# Patient Record
Sex: Male | Born: 1985 | Race: Black or African American | Hispanic: No | Marital: Single | Smoking: Current some day smoker
Health system: Southern US, Community
[De-identification: ages and names within clinical notes are randomized; demographics above are authoritative.]

## PROBLEM LIST (undated history)

## (undated) DIAGNOSIS — F419 Anxiety disorder, unspecified: Secondary | ICD-10-CM

## (undated) DIAGNOSIS — B019 Varicella without complication: Secondary | ICD-10-CM

## (undated) DIAGNOSIS — I10 Essential (primary) hypertension: Secondary | ICD-10-CM

## (undated) HISTORY — DX: Anxiety disorder, unspecified: F41.9

## (undated) HISTORY — DX: Essential (primary) hypertension: I10

## (undated) HISTORY — PX: NO PAST SURGERIES: SHX2092

## (undated) HISTORY — DX: Varicella without complication: B01.9

---

## 2005-03-07 ENCOUNTER — Emergency Department (HOSPITAL_COMMUNITY): Admission: EM | Admit: 2005-03-07 | Discharge: 2005-03-07 | Payer: Self-pay | Admitting: Family Medicine

## 2009-08-29 ENCOUNTER — Emergency Department (HOSPITAL_BASED_OUTPATIENT_CLINIC_OR_DEPARTMENT_OTHER): Admission: EM | Admit: 2009-08-29 | Discharge: 2009-08-29 | Payer: Self-pay | Admitting: Emergency Medicine

## 2009-08-29 ENCOUNTER — Ambulatory Visit: Payer: Self-pay | Admitting: Diagnostic Radiology

## 2010-11-11 IMAGING — CR DG CHEST 2V
2 series · 2 of 2 positions shown · non-contrast
Comparison: None.

CLINICAL DATA: Wheezing.  Congestion.  Cough.

CHEST - 2 VIEW

[w chest pa]
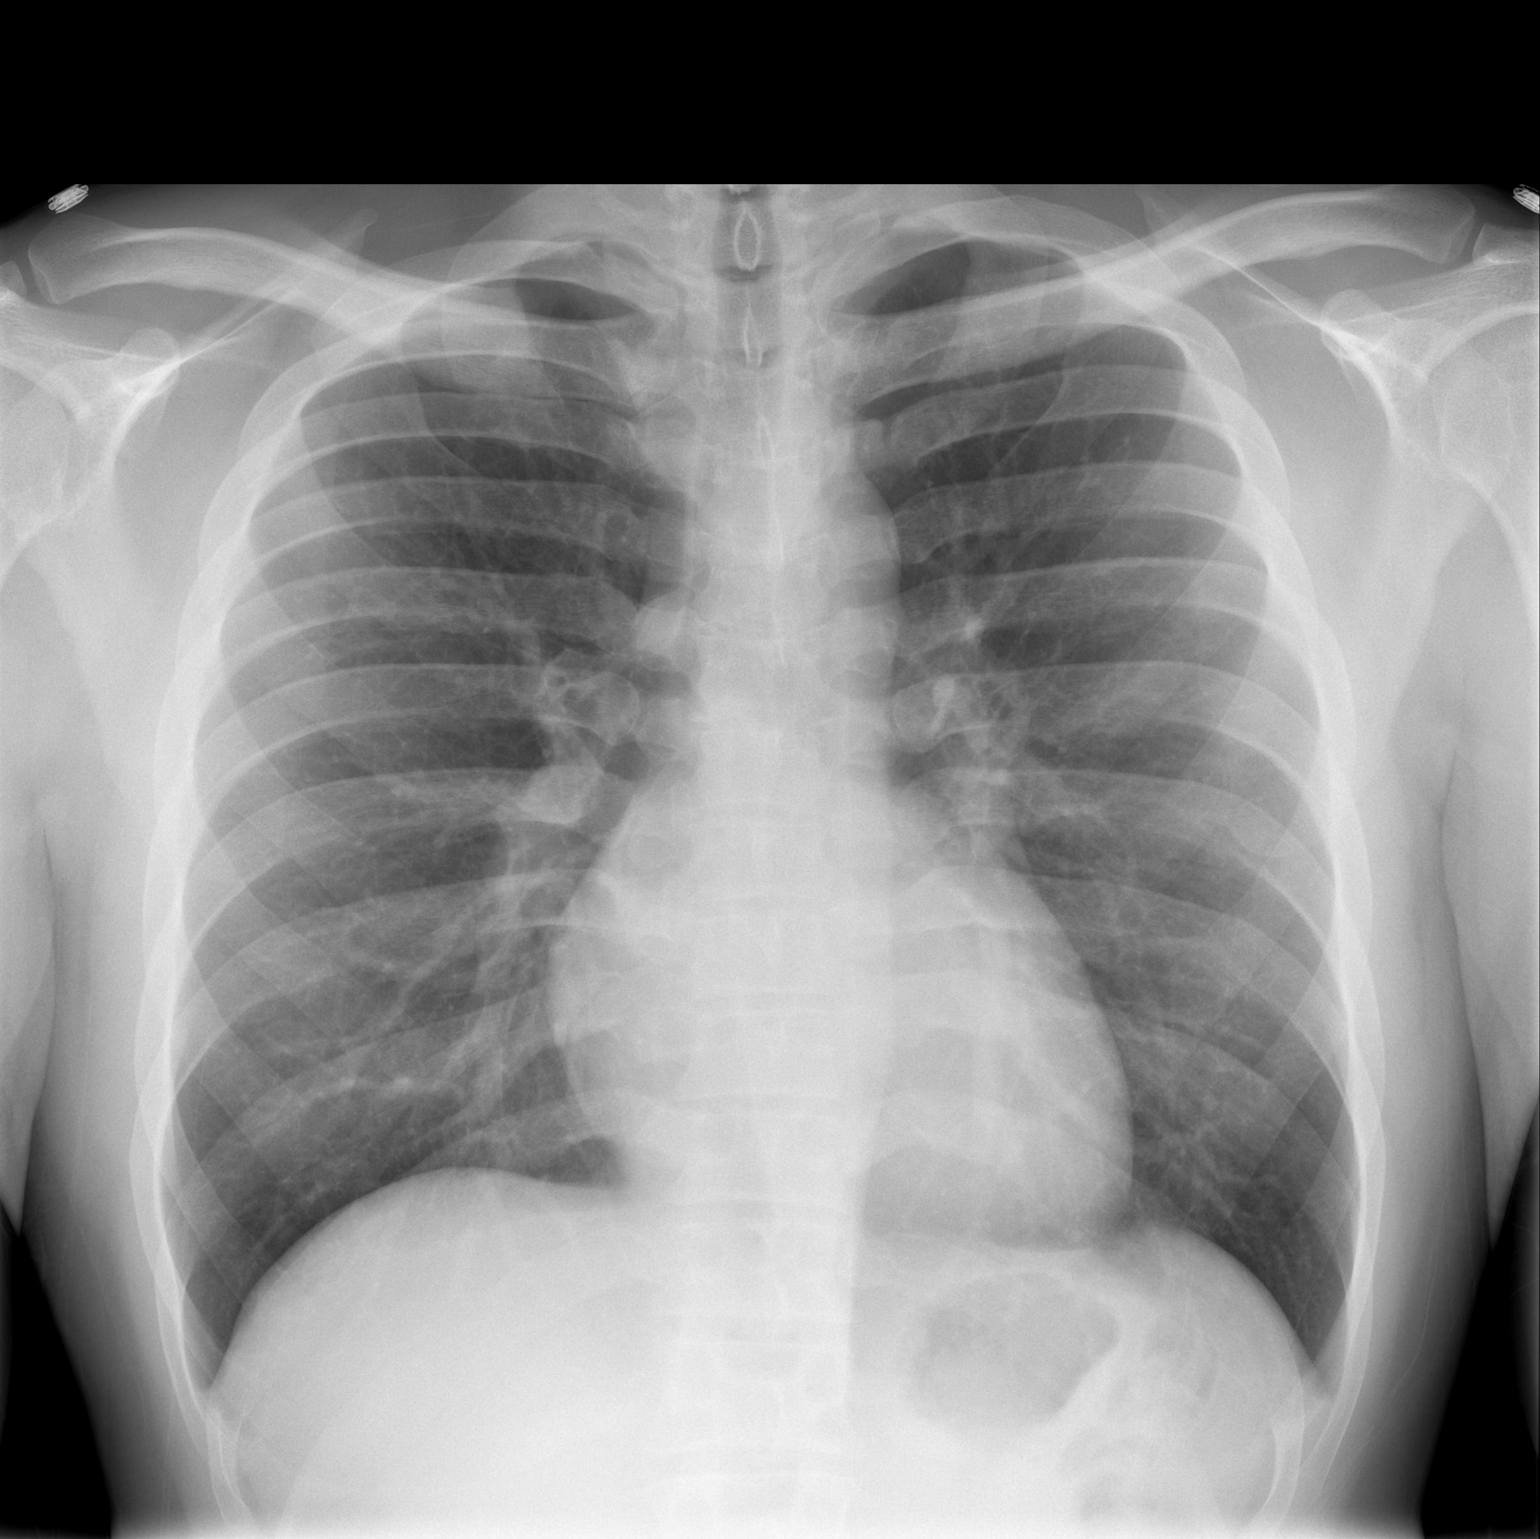

[w chest lat]
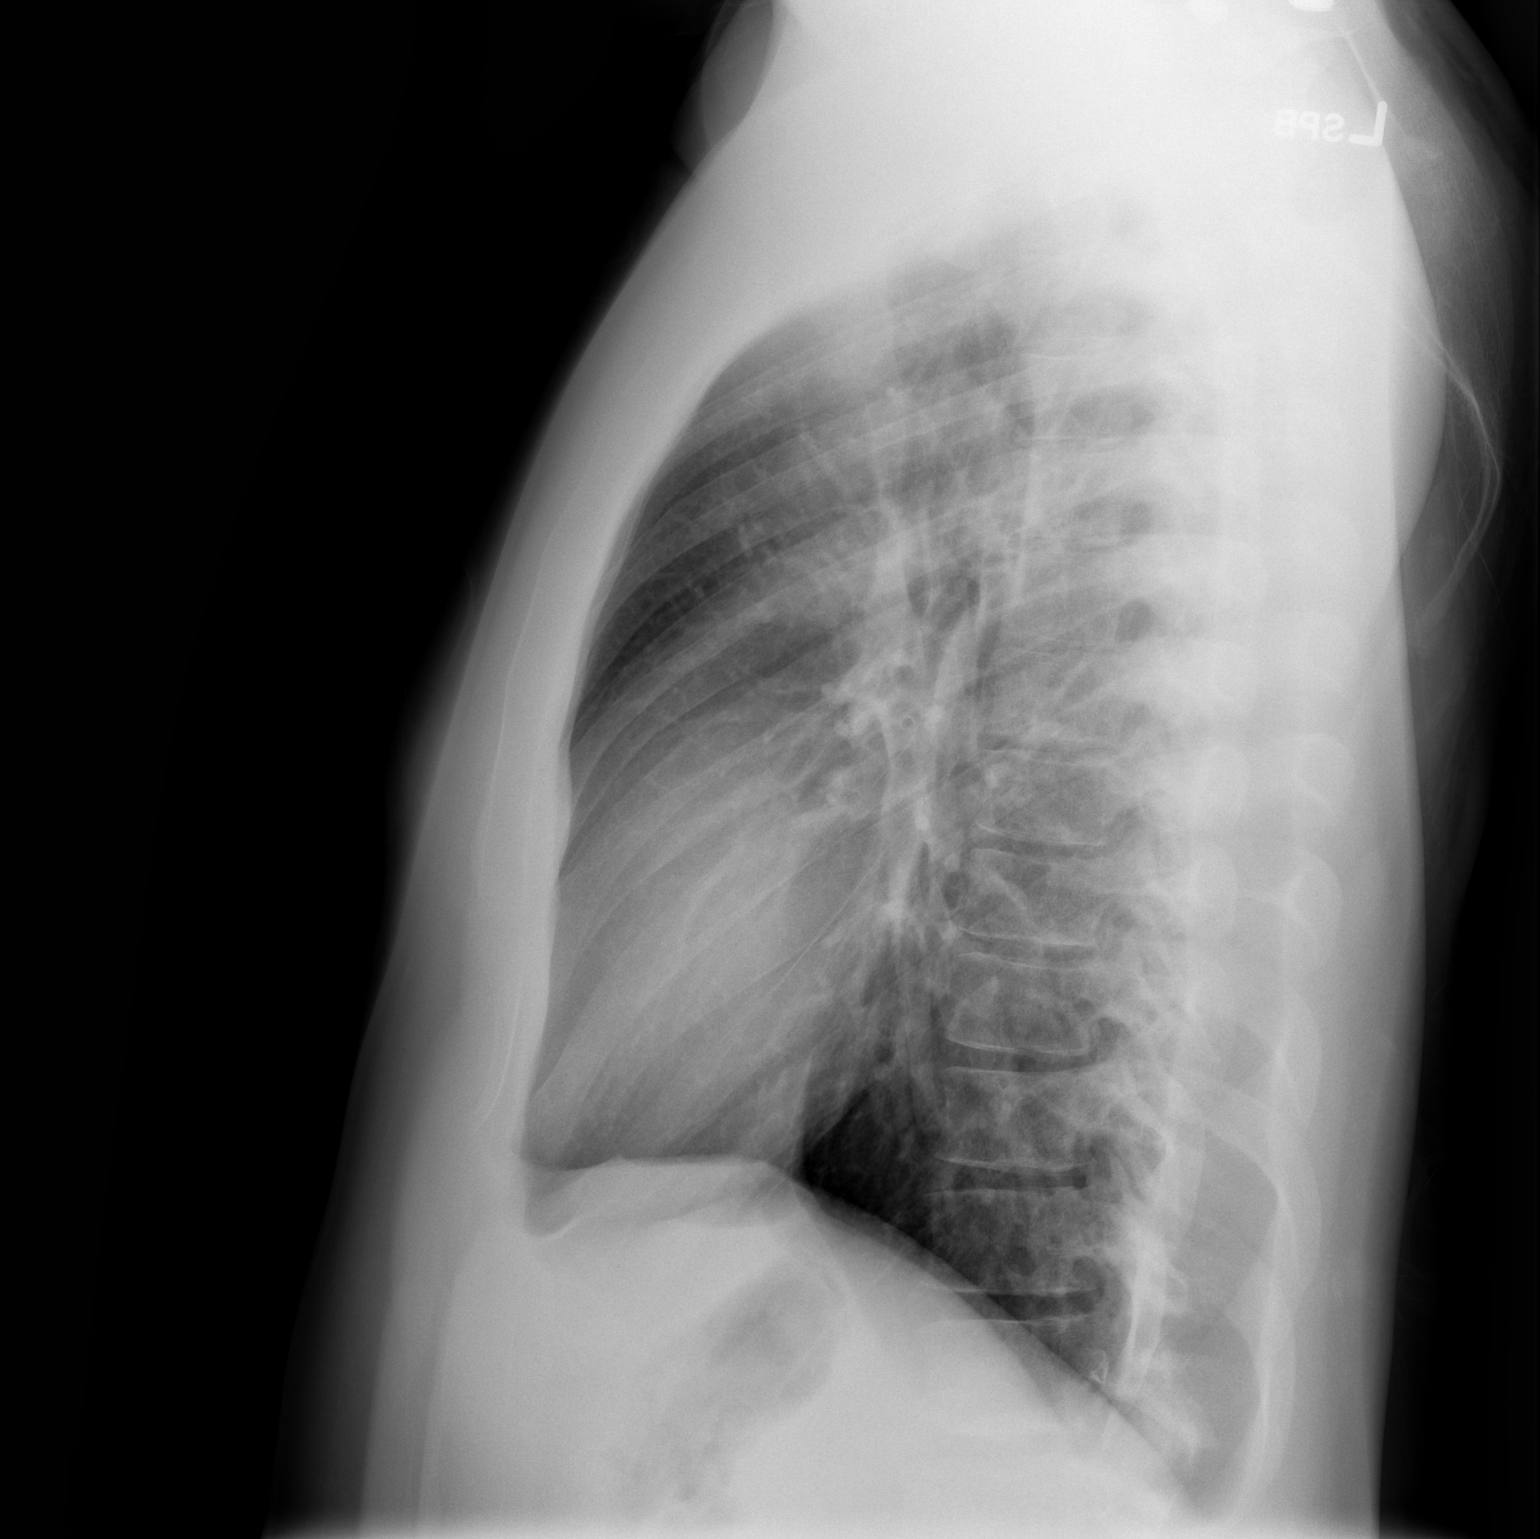

[2 of 2 positions shown; findings below may reference images not displayed]

FINDINGS: Lungs clear.  Cardiopericardial silhouette within normal
limits.
IMPRESSION: No active cardiopulmonary disease.

## 2015-10-08 ENCOUNTER — Encounter (HOSPITAL_BASED_OUTPATIENT_CLINIC_OR_DEPARTMENT_OTHER): Payer: Self-pay | Admitting: Emergency Medicine

## 2015-10-08 ENCOUNTER — Emergency Department (HOSPITAL_BASED_OUTPATIENT_CLINIC_OR_DEPARTMENT_OTHER)
Admission: EM | Admit: 2015-10-08 | Discharge: 2015-10-08 | Disposition: A | Discharge status: Home / Self Care | Payer: Self-pay | Attending: Emergency Medicine | Admitting: Emergency Medicine

## 2015-10-08 DIAGNOSIS — H5789 Other specified disorders of eye and adnexa: Secondary | ICD-10-CM

## 2015-10-08 DIAGNOSIS — F17200 Nicotine dependence, unspecified, uncomplicated: Secondary | ICD-10-CM | POA: Insufficient documentation

## 2015-10-08 DIAGNOSIS — H578 Other specified disorders of eye and adnexa: Secondary | ICD-10-CM | POA: Insufficient documentation

## 2015-10-08 MED ORDER — ERYTHROMYCIN 5 MG/GM OP OINT: TOPICAL_OINTMENT | OPHTHALMIC | Status: DC

## 2015-10-08 MED ORDER — FLUORESCEIN SODIUM 1 MG OP STRP
1.0000 | ORAL_STRIP | Freq: Once | OPHTHALMIC | Status: DC
  Filled 2015-10-08: qty 1

## 2015-10-08 NOTE — ED Notes (Signed)
DC instruction rev with pt and significant other, instruction on how to apply abx eye oint demonstrated and discussed, pt able to repeat back instructions, also stressed the importance of handwashing before and after doing eye oint therapy. Pts vs repeated prior to dc, again NBP noted to be elevated , discussed with pt and recommended finding an primary MD for follow, info sheet provided to pt

## 2015-10-08 NOTE — ED Provider Notes (Signed)
CSN: [id number]     Arrival date & time 10/08/15  0913 History   First MD Initiated Contact with Patient 10/08/15 [phone]     Chief Complaint  Patient presents with  . Eye Problem     (Consider location/radiation/quality/duration/timing/severity/associated sxs/prior Treatment) Patient is a 29 y.o. male presenting with eye problem. The history is provided by the patient.  Eye Problem Associated symptoms: redness   Associated symptoms: no headaches and no photophobia    patient awoke this morning at 6 in the morning with irritation to the left eye towards the nose. Patient felt as if there was a foreign body there. No history of any exposure to suggest a foreign body got in there. Patient does not wear contacts. No fevers. No rash no history of similar problem. No significant visual changes.  No past medical history on file. No past surgical history on file. No family history on file. Social History  Substance Use Topics  . Smoking status: Current Every Day Smoker  . Smokeless tobacco: None  . Alcohol Use: None    Review of Systems  Constitutional: Negative for fever.  HENT: Negative for congestion.   Eyes: Positive for pain and redness. Negative for photophobia.  Respiratory: Negative for shortness of breath.   Cardiovascular: Negative for chest pain.  Gastrointestinal: Negative for abdominal pain.  Musculoskeletal: Negative for neck pain.  Skin: Negative for rash.  Neurological: Negative for headaches.  Hematological: Does not bruise/bleed easily.  Psychiatric/Behavioral: Negative for confusion.      Allergies  Review of patient's allergies indicates no known allergies.  Home Medications   Prior to Admission medications   Medication Sig Start Date End Date Taking? Authorizing Provider  erythromycin ophthalmic ointment Place a 1/2 inch ribbon of ointment into the left lower eyelid. 10/08/15   Vanetta MuldersScott Jordi Lacko, MD   BP 146/99 mmHg  Pulse 81  Temp(Src) 97.5 F (36.4 C)  (Oral)  Resp 18  Ht 6\' 2"  (1.88 m)  Wt 245 lb (111.131 kg)  BMI 31.44 kg/m2  SpO2 100% Physical Exam  Constitutional: He is oriented to person, place, and time. He appears well-developed and well-nourished. No distress.  HENT:  Head: Normocephalic and atraumatic.  Mouth/Throat: Oropharynx is clear and moist.  Eyes: Conjunctivae and EOM are normal. Pupils are equal, round, and reactive to light.  Fluoroscopy seen staining and black light examination without evidence of any corneal abrasion or ulcer. No evidence of any foreign body. No hyphema.  Neck: Normal range of motion. Neck supple.  Cardiovascular: Normal rate and normal heart sounds.   No murmur heard. Pulmonary/Chest: Effort normal and breath sounds normal. No respiratory distress.  Abdominal: Soft. Bowel sounds are normal. There is no tenderness.  Neurological: He is alert and oriented to person, place, and time.  Nursing note and vitals reviewed.   ED Course  Procedures (including critical care time) Labs Review Labs Reviewed - No data to display  Imaging Review No results found. I have personally reviewed and evaluated these images and lab results as part of my medical decision-making.   EKG Interpretation None      MDM   Final diagnoses:  Irritation of left eye    Fluoroscopy seen staining and black light examination of left eye without evidence of any corneal abrasion or ulcer. No evidence of any scleral foreign body. The year tenths seems to be on the sclera towards the medial aspect. Suspect there was a foreign body there overnight and now is gone and just  has irritation. Will treat with erythromycin ophthalmic ointment. Patient will return for any new or worse symptoms. Patient does not wear contacts.    Vanetta Mulders, MD 10/08/15 1045

## 2015-10-08 NOTE — Discharge Instructions (Signed)
No evidence of any foreign body to left eye. No evidence of any abrasion or scratch to the cornea of the left eye. Suspect you have scleral irritation from the foreign body that probably got in the eye overnight. Use the antibiotic ointment as directed. Return for any new or worse symptoms.

## 2015-10-08 NOTE — ED Notes (Signed)
Pt woke up this am with sensation of having something in his eye.  Pt states its irritated and some redness.  Pt put some visine in and redness has improved.

## 2015-11-15 ENCOUNTER — Encounter: Payer: Self-pay | Admitting: Physician Assistant

## 2015-11-15 ENCOUNTER — Ambulatory Visit (INDEPENDENT_AMBULATORY_CARE_PROVIDER_SITE_OTHER): Payer: PRIVATE HEALTH INSURANCE | Admitting: Physician Assistant

## 2015-11-15 VITALS — BP 160/98 | HR 74 | Temp 98.1°F | Ht 72.5 in | Wt 255.0 lb

## 2015-11-15 DIAGNOSIS — I10 Essential (primary) hypertension: Secondary | ICD-10-CM

## 2015-11-15 DIAGNOSIS — F329 Major depressive disorder, single episode, unspecified: Secondary | ICD-10-CM

## 2015-11-15 DIAGNOSIS — F419 Anxiety disorder, unspecified: Secondary | ICD-10-CM

## 2015-11-15 DIAGNOSIS — F418 Other specified anxiety disorders: Secondary | ICD-10-CM | POA: Diagnosis not present

## 2015-11-15 DIAGNOSIS — F17200 Nicotine dependence, unspecified, uncomplicated: Secondary | ICD-10-CM

## 2015-11-15 DIAGNOSIS — F32A Depression, unspecified: Secondary | ICD-10-CM | POA: Insufficient documentation

## 2015-11-15 LAB — BASIC METABOLIC PANEL
BUN: 9 mg/dL (ref 6–23)
CALCIUM: 9.4 mg/dL (ref 8.4–10.5)
CO2: 29 meq/L (ref 19–32)
CREATININE: 1.12 mg/dL (ref 0.40–1.50)
Chloride: 103 mEq/L (ref 96–112)
GFR: 98.75 mL/min (ref >60.00)
Glucose, Bld: 104 mg/dL — ABNORMAL HIGH (ref 70–99)
Potassium: 3.2 mEq/L — ABNORMAL LOW (ref 3.5–5.1)
SODIUM: 141 meq/L (ref 135–145)

## 2015-11-15 LAB — CBC
HCT: 44.9 % (ref 39.0–52.0)
HEMOGLOBIN: 14.9 g/dL (ref 13.0–17.0)
MCHC: 33.2 g/dL (ref 30.0–36.0)
MCV: 91.6 fl (ref 78.0–100.0)
PLATELETS: 266 10*3/uL (ref 150.0–400.0)
RBC: 4.91 Mil/uL (ref 4.22–5.81)
RDW: 13.1 % (ref 11.5–15.5)
WBC: 8.3 10*3/uL (ref 4.0–10.5)

## 2015-11-15 LAB — TSH: TSH: 1.5 u[IU]/mL (ref 0.35–4.50)

## 2015-11-15 MED ORDER — NICOTINE 14 MG/24HR TD PT24: 14.0000 mg | MEDICATED_PATCH | Freq: Every day | TRANSDERMAL | Status: DC

## 2015-11-15 MED ORDER — HYDROCHLOROTHIAZIDE 12.5 MG PO TABS: 12.5000 mg | ORAL_TABLET | Freq: Every day | ORAL | Status: AC

## 2015-11-15 MED ORDER — SERTRALINE HCL 25 MG PO TABS: 25.0000 mg | ORAL_TABLET | Freq: Every day | ORAL | Status: DC

## 2015-11-15 NOTE — Progress Notes (Signed)
Patient presents to clinic today to establish care.  Acute Concerns: Patient notes negative and depressed mood, anhedonia, generalized anxiety/worry. Endorses history of SI/HI denies any at present. Denies panic attack. Endorses averaging 3-4 hours per night. Has never been treated for with this with medication. Has seen counseling several years back.   Chronic Issues: Hypertension -- Endorses noting this recently on visit to ER. Denies known history of this. Endorses headaches but denies vision changes, lightheadedness, chest pain or palpitations. Is currently a smoker.   BP Readings from Last 3 Encounters:  11/15/15 154/105  10/08/15 148/101   Tobacco Use Disorder -- Endorses 6 pack year smoking history. Is ready to quit. Has not tried anything.  Past Medical History  Diagnosis Date  . Hypertension   . Chicken pox   . Anxiety     Past Surgical History  Procedure Laterality Date  . No past surgeries      No current outpatient prescriptions on file prior to visit.   No current facility-administered medications on file prior to visit.    No Known Allergies  Family History  Problem Relation Age of Onset  . Cancer Maternal Grandfather     Lung  . Stroke Paternal Grandmother     Social History   Social History  . Marital Status: Single    Spouse Name: N/A  . Number of Children: N/A  . Years of Education: N/A   Occupational History  . Not on file.   Social History Main Topics  . Smoking status: Current Every Day Smoker -- 0.50 packs/day for 12 years    Types: Cigarettes  . Smokeless tobacco: Never Used  . Alcohol Use: No  . Drug Use: No  . Sexual Activity: Not on file   Other Topics Concern  . Not on file   Social History Narrative   Review of Systems  Constitutional: Positive for malaise/fatigue. Negative for fever and chills.  Eyes: Negative for blurred vision.  Respiratory: Negative for cough and shortness of breath.   Cardiovascular: Negative for  chest pain and palpitations.  Gastrointestinal: Negative for heartburn, nausea, vomiting, abdominal pain, diarrhea, constipation, blood in stool and melena.  Neurological: Positive for headaches. Negative for dizziness and loss of consciousness.  Psychiatric/Behavioral: Positive for depression and suicidal ideas. Negative for hallucinations, memory loss and substance abuse. The patient is nervous/anxious and has insomnia.     BP 154/105 mmHg  Pulse 80  Temp(Src) 98.1 F (36.7 C) (Oral)  Ht 6' 0.5" (1.842 m)  Wt 255 lb (115.667 kg)  BMI 34.09 kg/m2  SpO2 98%  Physical Exam  Constitutional: He is oriented to person, place, and time and well-developed, well-nourished, and in no distress.  HENT:  Head: Normocephalic and atraumatic.  Right Ear: External ear normal.  Left Ear: External ear normal.  Nose: Nose normal.  Mouth/Throat: Oropharynx is clear and moist. No oropharyngeal exudate.  TM within normal limits bilaterally.  Eyes: Conjunctivae are normal. Pupils are equal, round, and reactive to light.  Neck: Neck supple. No thyromegaly present.  Cardiovascular: Normal rate, regular rhythm, normal heart sounds and intact distal pulses.   Pulmonary/Chest: Effort normal and breath sounds normal. No respiratory distress. He has no wheezes. He has no rales. He exhibits no tenderness.  Abdominal: Soft. Bowel sounds are normal. He exhibits no distension and no mass. There is no tenderness. There is no rebound and no guarding.  Neurological: He is alert and oriented to person, place, and time.  Skin: Skin is  warm and dry. No rash noted.  Psychiatric: His mood appears anxious. He exhibits a depressed mood. He expresses no homicidal and no suicidal ideation. He expresses no suicidal plans and no homicidal plans. He has a flat affect.  Vitals reviewed.   No results found for this or any previous visit (from the past 2160 hour(s)).  Assessment/Plan: Tobacco use disorder Will begin Nicoderm  patches as directed. Will follow-up 1 month.  Essential hypertension, benign Likely contributors are diet, smoking and anxiety. Lab panel today. DASH diet discussed. Smoking cessation plan in place. Will begin HCTZ 12.5 mg daily. Follow-up 1 month.  Anxiety and depression Discussed treatment options. Plan to begin counseling. Handout on [facility] counselors given. Will begin Sertraline 25 mg daily. Side effect profile and plan discussed. Follow-up 1 month.

## 2015-11-15 NOTE — Progress Notes (Signed)
Pre visit review using our clinic review tool, if applicable. No additional management support is needed unless otherwise documented below in the visit note. 

## 2015-11-15 NOTE — Assessment & Plan Note (Signed)
Will begin Nicoderm patches as directed. Will follow-up 1 month.

## 2015-11-15 NOTE — Assessment & Plan Note (Signed)
Likely contributors are diet, smoking and anxiety. Lab panel today. DASH diet discussed. Smoking cessation plan in place. Will begin HCTZ 12.5 mg daily. Follow-up 1 month.

## 2015-11-15 NOTE — Assessment & Plan Note (Signed)
Discussed treatment options. Plan to begin counseling. Handout on [facility] counselors given. Will begin Sertraline 25 mg daily. Side effect profile and plan discussed. Follow-up 1 month.

## 2015-11-15 NOTE — Patient Instructions (Addendum)
Please go to the lab for blood work. I will call you with your results.   Please start the blood pressure medication first to help lower levels. Follow the diet below. Stay active.  Once tolerating the BP medication for a couple of days, start the Sertraline for your mood. Take daily as directed. I recommend you set up an appointment with our counselors as they are fantastic.   We will follow-up in 1 month. Sooner if needed based on lab results.  DASH Eating Plan DASH stands for "Dietary Approaches to Stop Hypertension." The DASH eating plan is a healthy eating plan that has been shown to reduce high blood pressure (hypertension). Additional health benefits may include reducing the risk of type 2 diabetes mellitus, heart disease, and stroke. The DASH eating plan may also help with weight loss. WHAT DO I NEED TO KNOW ABOUT THE DASH EATING PLAN? For the DASH eating plan, you will follow these general guidelines:  Choose foods with a percent daily value for sodium of less than 5% (as listed on the food label).  Use salt-free seasonings or herbs instead of table salt or sea salt.  Check with your health care provider or pharmacist before using salt substitutes.  Eat lower-sodium products, often labeled as "lower sodium" or "no salt added."  Eat fresh foods.  Eat more vegetables, fruits, and low-fat dairy products.  Choose whole grains. Look for the word "whole" as the first word in the ingredient list.  Choose fish and skinless chicken or [place] more often than red meat. Limit fish, poultry, and meat to 6 oz (170 g) each day.  Limit sweets, desserts, sugars, and sugary drinks.  Choose heart-healthy fats.  Limit cheese to 1 oz (28 g) per day.  Eat more home-cooked food and less restaurant, buffet, and fast food.  Limit fried foods.  Cook foods using methods other than frying.  Limit canned vegetables. If you do use them, rinse them well to decrease the sodium.  When eating at a  restaurant, ask that your food be prepared with less salt, or no salt if possible. WHAT FOODS CAN I EAT? Seek help from a dietitian for individual calorie needs. Grains Whole grain or whole wheat bread. Brown rice. Whole grain or whole wheat pasta. Quinoa, bulgur, and whole grain cereals. Low-sodium cereals. Corn or whole wheat flour tortillas. Whole grain cornbread. Whole grain crackers. Low-sodium crackers. Vegetables Fresh or frozen vegetables (raw, steamed, roasted, or grilled). Low-sodium or reduced-sodium tomato and vegetable juices. Low-sodium or reduced-sodium tomato sauce and paste. Low-sodium or reduced-sodium canned vegetables.  Fruits All fresh, canned (in natural juice), or frozen fruits. Meat and Other Protein Products Ground beef (85% or leaner), grass-fed beef, or beef trimmed of fat. Skinless chicken or [place]. Ground chicken or [place]. Pork trimmed of fat. All fish and seafood. Eggs. Dried beans, peas, or lentils. Unsalted nuts and seeds. Unsalted canned beans. Dairy Low-fat dairy products, such as skim or 1% milk, 2% or reduced-fat cheeses, low-fat ricotta or cottage cheese, or plain low-fat yogurt. Low-sodium or reduced-sodium cheeses. Fats and Oils Tub margarines without trans fats. Light or reduced-fat mayonnaise and salad dressings (reduced sodium). Avocado. Safflower, olive, or canola oils. Natural peanut or almond butter. Other Unsalted popcorn and pretzels. The items listed above may not be a complete list of recommended foods or beverages. Contact your dietitian for more options. WHAT FOODS ARE NOT RECOMMENDED? Grains White bread. White pasta. White rice. Refined cornbread. Bagels and croissants. Crackers that contain trans  fat. Vegetables Creamed or fried vegetables. Vegetables in a cheese sauce. Regular canned vegetables. Regular canned tomato sauce and paste. Regular tomato and vegetable juices. Fruits Dried fruits. Canned fruit in light or heavy syrup. Fruit  juice. Meat and Other Protein Products Fatty cuts of meat. Ribs, chicken wings, bacon, sausage, bologna, salami, chitterlings, fatback, hot dogs, bratwurst, and packaged luncheon meats. Salted nuts and seeds. Canned beans with salt. Dairy Whole or 2% milk, cream, half-and-half, and cream cheese. Whole-fat or sweetened yogurt. Full-fat cheeses or blue cheese. Nondairy creamers and whipped toppings. Processed cheese, cheese spreads, or cheese curds. Condiments Onion and garlic salt, seasoned salt, table salt, and sea salt. Canned and packaged gravies. Worcestershire sauce. Tartar sauce. Barbecue sauce. Teriyaki sauce. Soy sauce, including reduced sodium. Steak sauce. Fish sauce. Oyster sauce. Cocktail sauce. Horseradish. Ketchup and mustard. Meat flavorings and tenderizers. Bouillon cubes. Hot sauce. Tabasco sauce. Marinades. Taco seasonings. Relishes. Fats and Oils Butter, stick margarine, lard, shortening, ghee, and bacon fat. Coconut, palm kernel, or palm oils. Regular salad dressings. Other Pickles and olives. Salted popcorn and pretzels. The items listed above may not be a complete list of foods and beverages to avoid. Contact your dietitian for more information. WHERE CAN I FIND MORE INFORMATION? National Heart, Lung, and Blood Institute: CableProm[employer].itwww.nhlbi.nih.gov/health/health-topics/topics/dash/   This information is not intended to replace advice given to you by your health care provider. Make sure you discuss any questions you have with your health care provider.   Document Released: 11/16/2011 Document Revised: 12/18/2014 Document Reviewed: 10/01/2013 Elsevier Interactive Patient Education [employer]2016 Elsevier Inc.

## 2015-12-17 ENCOUNTER — Encounter: Payer: Self-pay | Admitting: Physician Assistant

## 2015-12-17 ENCOUNTER — Ambulatory Visit (INDEPENDENT_AMBULATORY_CARE_PROVIDER_SITE_OTHER): Payer: PRIVATE HEALTH INSURANCE | Admitting: Physician Assistant

## 2015-12-17 VITALS — BP 128/88 | HR 88 | Temp 98.2°F | Resp 18 | Ht 73.0 in | Wt 255.0 lb

## 2015-12-17 DIAGNOSIS — F17200 Nicotine dependence, unspecified, uncomplicated: Secondary | ICD-10-CM | POA: Diagnosis not present

## 2015-12-17 DIAGNOSIS — F418 Other specified anxiety disorders: Secondary | ICD-10-CM | POA: Diagnosis not present

## 2015-12-17 DIAGNOSIS — F32A Depression, unspecified: Secondary | ICD-10-CM

## 2015-12-17 DIAGNOSIS — F329 Major depressive disorder, single episode, unspecified: Secondary | ICD-10-CM

## 2015-12-17 DIAGNOSIS — I10 Essential (primary) hypertension: Secondary | ICD-10-CM

## 2015-12-17 DIAGNOSIS — F419 Anxiety disorder, unspecified: Secondary | ICD-10-CM

## 2015-12-17 LAB — BASIC METABOLIC PANEL
BUN: 9 mg/dL (ref 6–23)
CHLORIDE: 104 meq/L (ref 96–112)
CO2: 31 meq/L (ref 19–32)
Calcium: 9.5 mg/dL (ref 8.4–10.5)
Creatinine, Ser: 1.24 mg/dL (ref 0.40–1.50)
GFR: 87.76 mL/min (ref >60.00)
GLUCOSE: 145 mg/dL — AB (ref 70–99)
POTASSIUM: 3.5 meq/L (ref 3.5–5.1)
Sodium: 142 mEq/L (ref 135–145)

## 2015-12-17 MED ORDER — NICOTINE 7 MG/24HR TD PT24: 7.0000 mg | MEDICATED_PATCH | Freq: Every day | TRANSDERMAL | Status: AC

## 2015-12-17 NOTE — Progress Notes (Signed)
Patient presents to clinic today for follow-up of multiple medical issues.  Hypertension -- Is taking HCTZ daily as directed. Denies side effects of medication. Patient denies chest pain, palpitations, lightheadedness, dizziness, vision changes or frequent headaches.  BP Readings from Last 3 Encounters:  12/17/15 128/88  11/15/15 160/98  10/08/15 148/101   Tobacco Use Disorder -- Is using Nicotine patch as directed (14 mg/24 hour patch). Is down to 0-2 cigarettes daily.  Is happy with progress.  Anxiety/Depression -- Has not been taking Sertraline as he was nervous about taking the medication. Was also referred to counseling but has not been able to schedule yet. Has noticed an improvement in mood overall and feels he does not need medication at this point. Denies SI/HI.   Past Medical History  Diagnosis Date  . Hypertension   . Chicken pox   . Anxiety     Current Outpatient Prescriptions on File Prior to Visit  Medication Sig Dispense Refill  . hydrochlorothiazide (HYDRODIURIL) 12.5 MG tablet Take 1 tablet (12.5 mg total) by mouth daily. 30 tablet 1   No current facility-administered medications on file prior to visit.    No Known Allergies  Family History  Problem Relation Age of Onset  . Cancer Maternal Grandfather     Lung  . Stroke Paternal Grandmother     Social History   Social History  . Marital Status: Single    Spouse Name: N/A  . Number of Children: N/A  . Years of Education: N/A   Social History Main Topics  . Smoking status: Current Some Day Smoker -- 0.50 packs/day for 12 years    Types: Cigarettes  . Smokeless tobacco: Never Used  . Alcohol Use: No  . Drug Use: No  . Sexual Activity: Not Asked   Other Topics Concern  . None   Social History Narrative   Review of Systems - See HPI.  All other ROS are negative.  BP 128/88 mmHg  Pulse 88  Temp(Src) 98.2 F (36.8 C) (Oral)  Resp 18  Ht 6\' 1"  (1.854 m)  Wt 255 lb (115.667 kg)  BMI  33.65 kg/m2  SpO2 98%  Physical Exam  Constitutional: He is oriented to person, place, and time and well-developed, well-nourished, and in no distress.  HENT:  Head: Normocephalic and atraumatic.  Cardiovascular: Normal rate, regular rhythm, normal heart sounds and intact distal pulses.   Pulmonary/Chest: Effort normal and breath sounds normal. No respiratory distress. He has no wheezes. He has no rales. He exhibits no tenderness.  Neurological: He is alert and oriented to person, place, and time.  Vitals reviewed.   Recent Results (from the past 2160 hour(s))  CBC     Status: None   Collection Time: 11/15/15  9:34 AM  Result Value Ref Range   WBC 8.3 4.0 - 10.5 K/uL   RBC 4.91 4.22 - 5.81 Mil/uL   Platelets 266.0 150.0 - 400.0 K/uL   Hemoglobin 14.9 13.0 - 17.0 g/dL   HCT 13.2 44.0 - 10.2 %   MCV 91.6 78.0 - 100.0 fl   MCHC 33.2 30.0 - 36.0 g/dL   RDW 72.5 36.6 - 44.0 %  TSH     Status: None   Collection Time: 11/15/15  9:34 AM  Result Value Ref Range   TSH 1.50 0.35 - 4.50 uIU/mL  Basic Metabolic Panel (BMET)     Status: Abnormal   Collection Time: 11/15/15  9:34 AM  Result Value Ref Range   Sodium  141 135 - 145 mEq/L   Potassium 3.2 (L) 3.5 - 5.1 mEq/L   Chloride 103 96 - 112 mEq/L   CO2 29 19 - 32 mEq/L   Glucose, Bld 104 (H) 70 - 99 mg/dL   BUN 9 6 - 23 mg/dL   Creatinine, Ser 1.611.12 0.40 - 1.50 mg/dL   Calcium 9.4 8.4 - 09.610.5 mg/dL   GFR 04.5498.75 >09.81>60.00 mL/min    Assessment/Plan: Essential hypertension, benign BP much improved. Will continue HCTZ. Will check BMP today. Follow-up 2 months.  Anxiety and depression Patient endorses doing well despite not taking medication. Would like to hold off on meds for now. Will continue to follow.  Tobacco use disorder Down to 0-2 cigarettes per day. Will decrease to 7 mg/hr Nicoderm patch to take until complete cessation.

## 2015-12-17 NOTE — Assessment & Plan Note (Signed)
Patient endorses doing well despite not taking medication. Would like to hold off on meds for now. Will continue to follow.

## 2015-12-17 NOTE — Progress Notes (Signed)
Pre visit review using our clinic review tool, if applicable. No additional management support is needed unless otherwise documented below in the visit note/SLS  

## 2015-12-17 NOTE — Assessment & Plan Note (Signed)
Down to 0-2 cigarettes per day. Will decrease to 7 mg/hr Nicoderm patch to take until complete cessation.

## 2015-12-17 NOTE — Patient Instructions (Signed)
Please continue the blood pressure medication as directed. Stop by the lab for blood work. I will call you with your results.  Please start the lower dose of the Nicotine patch until you completely stop smoking.   If mood worsens, please call or come see me.  Remember to soak toes in a basin of warm water with distilled white vinegar for 3 times a week.

## 2015-12-17 NOTE — Assessment & Plan Note (Signed)
BP much improved. Will continue HCTZ. Will check BMP today. Follow-up 2 months.

## 2018-05-03 ENCOUNTER — Encounter: Payer: Self-pay | Admitting: Emergency Medicine
